# Patient Record
Sex: Female | Born: 1935 | Race: White | Hispanic: No | Smoking: Never smoker
Health system: Southern US, Community
[De-identification: ages and names within clinical notes are randomized; demographics above are authoritative.]

## PROBLEM LIST (undated history)

## (undated) DIAGNOSIS — M1990 Unspecified osteoarthritis, unspecified site: Secondary | ICD-10-CM

## (undated) DIAGNOSIS — C50919 Malignant neoplasm of unspecified site of unspecified female breast: Secondary | ICD-10-CM

## (undated) HISTORY — PX: MASTECTOMY: SHX3

## (undated) HISTORY — PX: JOINT REPLACEMENT: SHX530

## (undated) HISTORY — PX: ABDOMINAL HYSTERECTOMY: SHX81

## (undated) HISTORY — DX: Malignant neoplasm of unspecified site of unspecified female breast: C50.919

---

## 2013-03-16 ENCOUNTER — Other Ambulatory Visit (HOSPITAL_COMMUNITY): Payer: Self-pay | Admitting: "Endocrinology

## 2013-03-16 DIAGNOSIS — E041 Nontoxic single thyroid nodule: Secondary | ICD-10-CM

## 2013-03-22 ENCOUNTER — Ambulatory Visit (HOSPITAL_COMMUNITY)
Admission: RE | Admit: 2013-03-22 | Discharge: 2013-03-22 | Disposition: A | Discharge status: Home / Self Care | Payer: Medicare Other | Source: Ambulatory Visit | Attending: "Endocrinology | Admitting: "Endocrinology

## 2013-03-22 ENCOUNTER — Encounter (HOSPITAL_COMMUNITY): Payer: Self-pay

## 2013-03-22 DIAGNOSIS — E041 Nontoxic single thyroid nodule: Secondary | ICD-10-CM

## 2013-03-22 MED ORDER — LIDOCAINE HCL (PF) 2 % IJ SOLN
INTRAMUSCULAR | Status: AC
  Administered 2013-03-22: 10 mL
  Filled 2013-03-22: qty 10

## 2013-03-22 MED ORDER — LIDOCAINE HCL (PF) 2 % IJ SOLN
10.0000 mL | Freq: Once | INTRAMUSCULAR | Status: AC
  Administered 2013-03-22: 10 mL

## 2013-03-22 NOTE — Progress Notes (Signed)
Biopsy complete no signs of distress  

## 2015-12-03 ENCOUNTER — Ambulatory Visit: Payer: Medicare Other | Admitting: Neurology

## 2019-02-23 ENCOUNTER — Encounter (HOSPITAL_COMMUNITY): Payer: Self-pay | Admitting: Emergency Medicine

## 2019-02-23 ENCOUNTER — Other Ambulatory Visit: Payer: Self-pay

## 2019-02-23 ENCOUNTER — Emergency Department (HOSPITAL_COMMUNITY): Payer: Medicare Other

## 2019-02-23 ENCOUNTER — Emergency Department (HOSPITAL_COMMUNITY)
Admission: EM | Admit: 2019-02-23 | Discharge: 2019-02-23 | Disposition: A | Discharge status: Home / Self Care | Payer: Medicare Other | Attending: Emergency Medicine | Admitting: Emergency Medicine

## 2019-02-23 DIAGNOSIS — W010XXA Fall on same level from slipping, tripping and stumbling without subsequent striking against object, initial encounter: Secondary | ICD-10-CM | POA: Diagnosis not present

## 2019-02-23 DIAGNOSIS — S42002A Fracture of unspecified part of left clavicle, initial encounter for closed fracture: Secondary | ICD-10-CM | POA: Diagnosis not present

## 2019-02-23 DIAGNOSIS — S42125A Nondisplaced fracture of acromial process, left shoulder, initial encounter for closed fracture: Secondary | ICD-10-CM | POA: Diagnosis not present

## 2019-02-23 DIAGNOSIS — Y9389 Activity, other specified: Secondary | ICD-10-CM | POA: Diagnosis not present

## 2019-02-23 DIAGNOSIS — S4992XA Unspecified injury of left shoulder and upper arm, initial encounter: Secondary | ICD-10-CM | POA: Diagnosis present

## 2019-02-23 DIAGNOSIS — Y999 Unspecified external cause status: Secondary | ICD-10-CM | POA: Diagnosis not present

## 2019-02-23 DIAGNOSIS — Y929 Unspecified place or not applicable: Secondary | ICD-10-CM | POA: Diagnosis not present

## 2019-02-23 DIAGNOSIS — Z79899 Other long term (current) drug therapy: Secondary | ICD-10-CM | POA: Insufficient documentation

## 2019-02-23 HISTORY — DX: Unspecified osteoarthritis, unspecified site: M19.90

## 2019-02-23 MED ORDER — ONDANSETRON HCL 4 MG PO TABS
4.0000 mg | ORAL_TABLET | Freq: Once | ORAL | Status: AC
  Administered 2019-02-23: 4 mg via ORAL
  Filled 2019-02-23: qty 1

## 2019-02-23 MED ORDER — HYDROCODONE-ACETAMINOPHEN 5-325 MG PO TABS
1.0000 | ORAL_TABLET | Freq: Once | ORAL | Status: AC
  Administered 2019-02-23: 21:00:00 1 via ORAL
  Filled 2019-02-23: qty 1

## 2019-02-23 NOTE — ED Triage Notes (Signed)
Patient states she fell while chasing her dog today and is complaining of left shoulder pain.

## 2019-02-23 NOTE — Discharge Instructions (Signed)
Your clavicle or collarbone is broken on the left side, and 1 of the other bones of your upper shoulder called the acromium is also broken.  Please use an ice pack to your shoulder.  Please use the shoulder immobilizer until you are seen by the orthopedic specialist.  Please see Dr. Aline Brochure, or the orthopedic specialist of your choice concerning the fractures of your shoulder.  Please do not do any lifting or pushing or straining.  Use Tylenol for mild pain.  Use your hydrocodone for more severe pain.

## 2019-02-23 NOTE — ED Notes (Signed)
Patient transported to X-ray 

## 2019-02-23 NOTE — ED Provider Notes (Signed)
[facility] EMERGENCY DEPARTMENT Provider Note   CSN: [id number] Arrival date & time: 02/23/19  1709     History   Chief Complaint Chief Complaint  Patient presents with  . Shoulder Pain    HPI Bailey Anderson is a 85 y.o. female.     Patient is an 85 year old female who presents to the emergency department with injury to the left shoulder following a fall.  The patient states that she was going down an incline, stumbled, fell, and hit her shoulder on a city [place].  She has been unable to lift her arm since that time.  The pain is been getting progressively worse.  The patient denies any other injury.  She denies being on any anticoagulation medications.  No previous operations or procedures involving the left upper extremity.  The history is provided by the patient.  Shoulder Pain Associated symptoms: no back pain and no neck pain     Past Medical History:  Diagnosis Date  . Arthritis     There are no active problems to display for this patient.   Past Surgical History:  Procedure Laterality Date  . ABDOMINAL HYSTERECTOMY    . JOINT REPLACEMENT    . MASTECTOMY Right      OB History   No obstetric history on file.      Home Medications    Prior to Admission medications   Medication Sig Start Date End Date Taking? Authorizing Provider  celecoxib (CELEBREX) 50 MG capsule Take 50 mg by mouth daily.  01/19/19  Yes [provider]  Cholecalciferol (VITAMIN D-1000 MAX ST) 25 MCG (1000 UT) tablet Take 1,000 Units by mouth daily.    Yes [provider]  doxycycline (VIBRAMYCIN) 100 MG capsule Take 100 mg by mouth 2 (two) times daily. 10 day course starting on 02/09/2019 02/09/19  Yes [provider]  gabapentin (NEURONTIN) 300 MG capsule Take 300 mg by mouth 3 (three) times daily.  12/20/18  Yes [provider]  HYDROcodone-acetaminophen (NORCO) 10-325 MG tablet Take 1 tablet by mouth every 6 (six) hours as needed for moderate pain or  severe pain.  02/23/19  Yes [provider]  methotrexate (RHEUMATREX) 2.5 MG tablet Take by mouth.  02/10/19  Yes [provider]  Omega-3 Fatty Acids (OMEGA-3 2100) 1050 MG CAPS Take 1 capsule by mouth daily.    Yes [provider]  tiZANidine (ZANAFLEX) 4 MG tablet Take 4 mg by mouth every 8 (eight) hours as needed for muscle spasms.  01/19/19  Yes [provider]    Family History History reviewed. No pertinent family history.  Social History Social History   Tobacco Use  . Smoking status: Never Smoker  . Smokeless tobacco: Never Used  Substance Use Topics  . Alcohol use: No  . Drug use: No     Allergies   Red dye   Review of Systems Review of Systems  Constitutional: Negative for activity change and appetite change.  HENT: Negative for congestion, ear discharge, ear pain, facial swelling, nosebleeds, rhinorrhea, sneezing and tinnitus.   Eyes: Negative for photophobia, pain and discharge.  Respiratory: Negative for cough, choking, shortness of breath and wheezing.   Cardiovascular: Negative for chest pain, palpitations and leg swelling.  Gastrointestinal: Negative for abdominal pain, blood in stool, constipation, diarrhea, nausea and vomiting.  Genitourinary: Negative for difficulty urinating, dysuria, flank pain, frequency and hematuria.  Musculoskeletal: Positive for arthralgias. Negative for back pain, gait problem, myalgias and neck pain.  Skin:  Negative for color change, rash and wound.  Neurological: Negative for dizziness, seizures, syncope, facial asymmetry, speech difficulty, weakness and numbness.  Hematological: Negative for adenopathy. Does not bruise/bleed easily.  Psychiatric/Behavioral: Negative for agitation, confusion, hallucinations, self-injury and suicidal ideas. The patient is not nervous/anxious.      Physical Exam Updated Vital Signs BP (!) 171/82 (BP Location: Right Arm)   Pulse 85   Temp 98.1 F (36.7 C)  (Oral)   Resp 20   Ht 5\' 4"  (1.626 m)   Wt 59.9 kg   SpO2 95%   BMI 22.66 kg/m   Physical Exam Vitals signs and nursing note reviewed.  Constitutional:      Appearance: She is well-developed. She is not toxic-appearing.  HENT:     Head: Normocephalic and atraumatic.     Right Ear: Tympanic membrane and external ear normal.     Left Ear: Tympanic membrane and external ear normal.     Nose: Nose normal.     Mouth/Throat:     Mouth: Mucous membranes are moist.     Pharynx: Oropharynx is clear.  Eyes:     General: Lids are normal.     Extraocular Movements: Extraocular movements intact.     Pupils: Pupils are equal, round, and reactive to light.  Neck:     Musculoskeletal: Normal range of motion and neck supple. No muscular tenderness.     Vascular: No carotid bruit.  Cardiovascular:     Rate and Rhythm: Normal rate and regular rhythm.     Pulses: Normal pulses.     Heart sounds: Normal heart sounds.  Pulmonary:     Effort: Pulmonary effort is normal. No respiratory distress.     Breath sounds: Normal breath sounds. No wheezing.  Abdominal:     General: Abdomen is flat. Bowel sounds are normal.     Palpations: Abdomen is soft.     Tenderness: There is no abdominal tenderness. There is no guarding.     Comments: No rib area tenderness.  Musculoskeletal: Normal range of motion.     Comments: There is pain over the left clavicle, as well as pain to the humeral head area.  There is no displacement of the scapula.  There is good range of motion of the left elbow, wrist, and fingers.  There is degenerative joint disease changes present. There is full range of motion of the right shoulder, elbow, wrist, and fingers. Full range of motion of right and left lower extremities.  Lymphadenopathy:     Head:     Right side of head: No submandibular adenopathy.     Left side of head: No submandibular adenopathy.     Cervical: No cervical adenopathy.  Skin:    General: Skin is warm and  dry.  Neurological:     Mental Status: She is alert and oriented to person, place, and time.     Cranial Nerves: No cranial nerve deficit.     Sensory: No sensory deficit.  Psychiatric:        Speech: Speech normal.      ED Treatments / Results  Labs (all labs ordered are listed, but only abnormal results are displayed) Labs Reviewed - No data to display  EKG None  Radiology Dg Shoulder Left  Result Date: 02/23/2019 CLINICAL DATA:  Fall today with left shoulder pain EXAM: LEFT SHOULDER - 2+ VIEW COMPARISON:  None. FINDINGS: Comminuted fracture of the lateral left clavicle without significant displacement. Questionable nondisplaced acromion fracture on scapular  Y-view. No evidence of acromioclavicular separation. No glenohumeral dislocation. No suspicious focal osseous lesions. IMPRESSION: 1. Comminuted lateral left clavicle fracture without significant displacement. 2. Questionable nondisplaced left acromion fracture. Left shoulder CT may be considered for further characterization. 3. No glenohumeral dislocation. Electronically Signed   By: Ilona Sorrel M.D.   On: 02/23/2019 20:09    Procedures Procedures (including critical care time) FRACTURE CARE LEFT CLAVICLE/SHOULDER  Patient sustained a fall and injury to the left collarbone and clavicle.  X-ray shows a comminuted distal left clavicle fracture.  There is also a nondisplaced fracture of the acromion.  I discussed these fractures with the patient in terms which he understands.  I discussed with her the need for immobilization.  Patient gives permission for the procedures.  Patient identified by armband.  Procedure timeout taken.  The patient was fitted with a shoulder immobilizer.  Ice pack applied.  Patient given medication for assistance with pain.  Patient tolerated the procedure without problem.  After the procedure, the capillary refill is less than 2 seconds.  There are no temperature changes of the left upper extremity.   There is no complaint of the immobilizer being too tight or causing any other problem.  Patient tolerated procedure without problem.    Medications Ordered in ED Medications  HYDROcodone-acetaminophen (NORCO/VICODIN) 5-325 MG per tablet 1 tablet (1 tablet Oral Given 02/23/19 2052)  ondansetron (ZOFRAN) tablet 4 mg (4 mg Oral Given 02/23/19 2052)     Initial Impression / Assessment and Plan / ED Course  I have reviewed the triage vital signs and the nursing notes.  Pertinent labs & imaging results that were available during my care of the patient were reviewed by me and considered in my medical decision making (see chart for details).          Final Clinical Impressions(s) / ED Diagnoses MDM  Blood pressure is elevated at 171/82, otherwise vital signs within normal limits.  Pulse oximetry is 95% on room air.  Within normal limits by my interpretation.  The patient sustained a fall and injury to the left shoulder.  She is unable to raise her arm.  Palpation to the left shoulder is painful.  X-ray of the left shoulder shows a comminuted fracture of the lateral left clavicle without significant displacement.  There is also question of a nondisplaced acromion fracture.  A CT scan of the shoulder was suggested for more clear delineation of the fractures.  This is been ordered.  Patient treated in the emergency department with oral Norco to assist with her pain.  I have discussed with the patient the findings of the examination and the x-ray thus far.  CT scan confirms the comminuted distal left clavicle fracture.  This does not extend into the acromioclavicular joint space.  There is also a nondisplaced fracture of the acromion.  The patient has been fitted with a shoulder immobilizer and ice pack.  I have asked the patient to see Dr. Aline Brochure for reevaluation as soon as possible.  Patient is in agreement with this plan.   Final diagnoses:  Closed nondisplaced fracture of left  clavicle, unspecified part of clavicle, initial encounter  Closed nondisplaced fracture of acromial process of left scapula, initial encounter    ED Discharge Orders    None       Lily Kocher, PA-C 02/24/19 1617    Milton Ferguson, MD 02/26/19 1039

## 2020-10-18 IMAGING — CT CT SHOULDER*L* W/O CM
3 of 4 series · 14 of 33 positions shown, 16 images · non-contrast
Comparison: Left shoulder radiographs from the same day

CLINICAL DATA: Left clavicle fracture

EXAM:
CT OF THE UPPER LEFT EXTREMITY WITHOUT CONTRAST
TECHNIQUE: Multidetector CT imaging of the upper left extremity was performed
according to the standard protocol.

[Series 5: sag bone · sagittal · 0.26mm/px · 5 of 97 slices shown]
[im 17/97  bone]
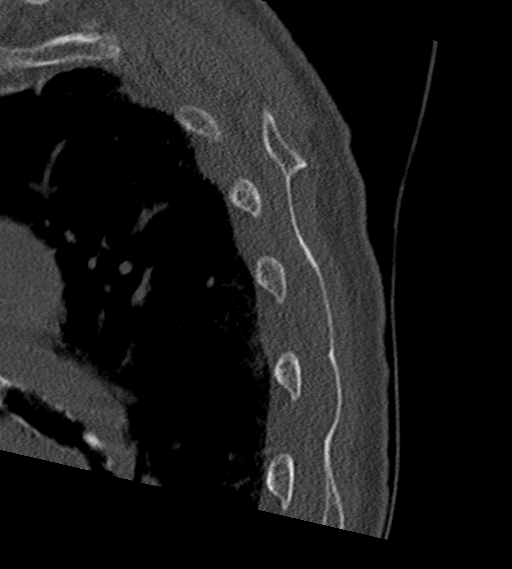
[im 33/97  bone]
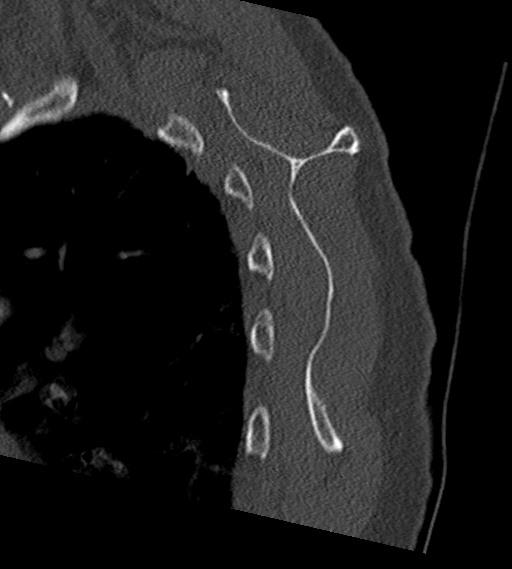
[im 49/97  bone]
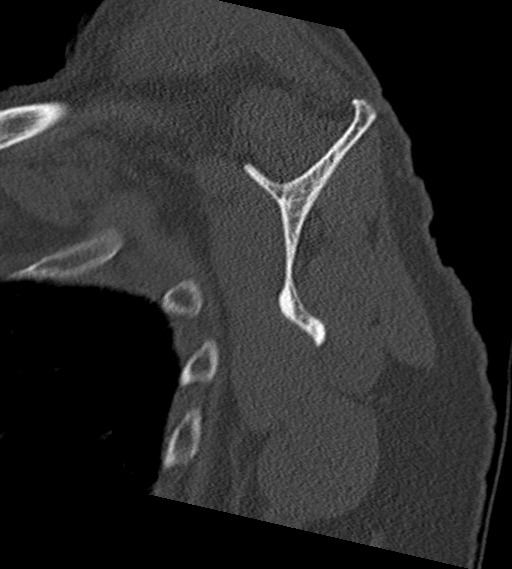
[im 65/97  bone]
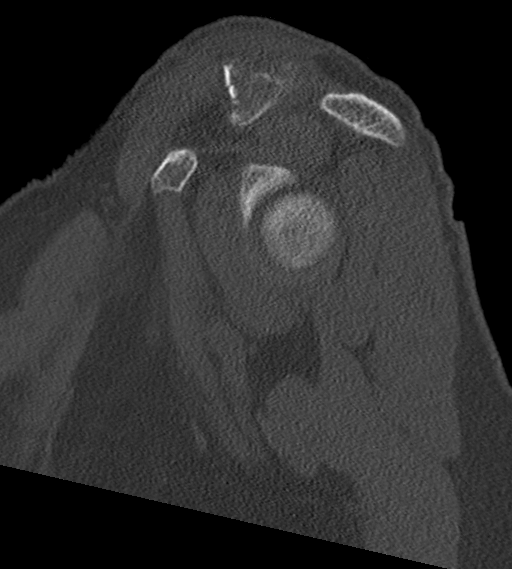
[im 81/97  bone]
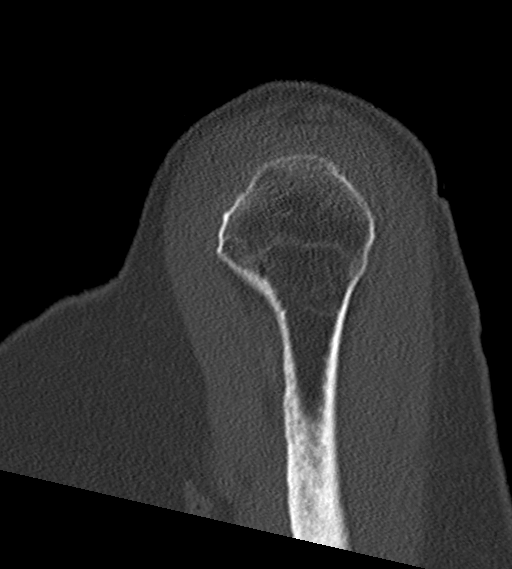

[Series 6: ax st · axial · 0.27mm/px · z∈[+1179,+1279]mm · 6 of 69 slices shown, 8 images]
[im 11/69  soft-tissue]
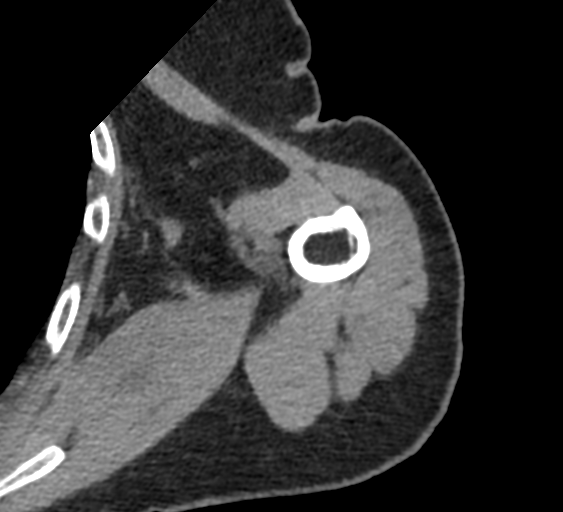
[im 11/69  bone]
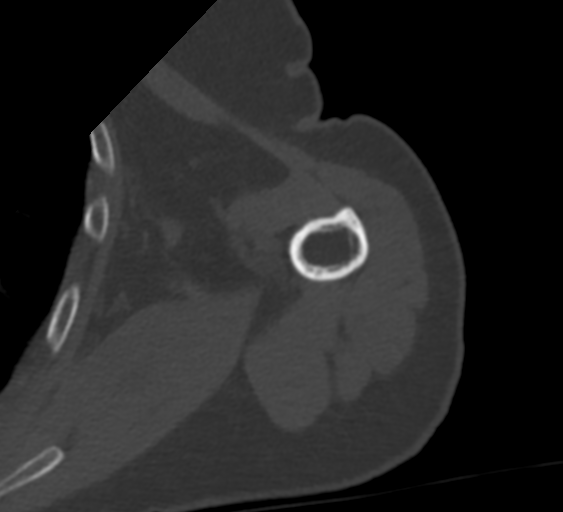
[im 21/69  bone]
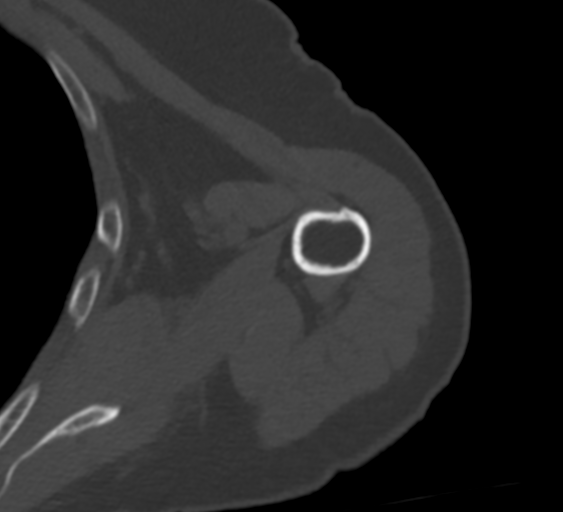
[im 32/69  bone]
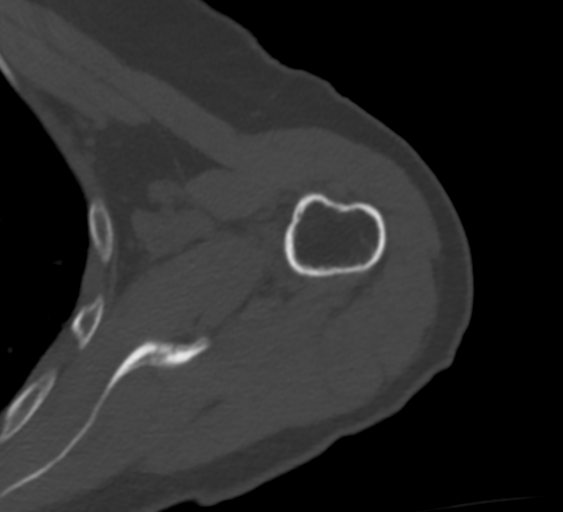
[im 42/69  bone]
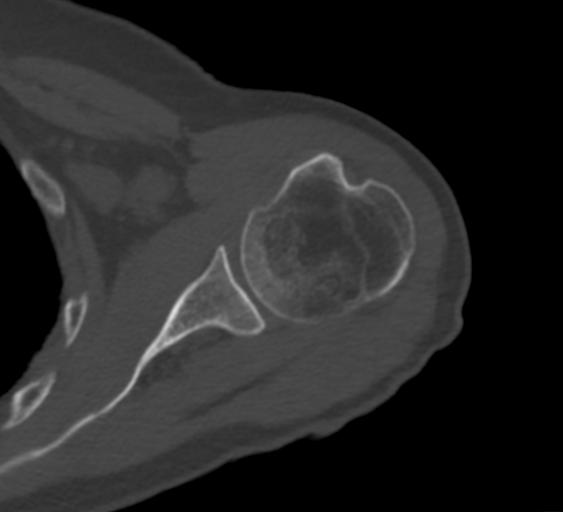
[im 53/69  soft-tissue]
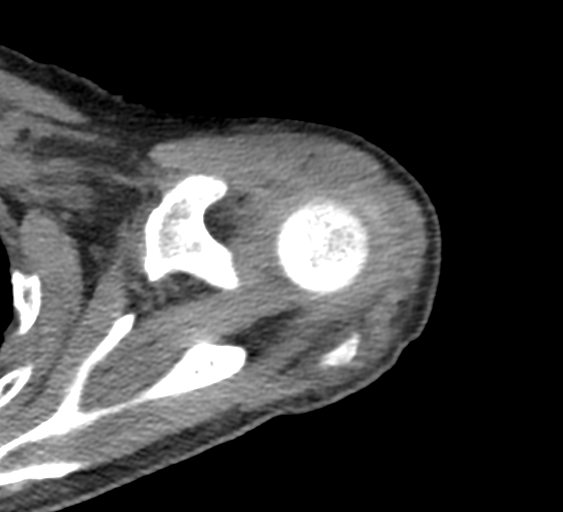
[im 53/69  bone]
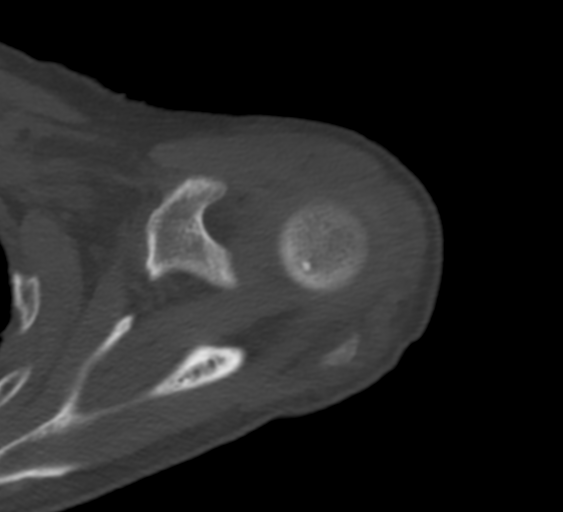
[im 63/69  bone]
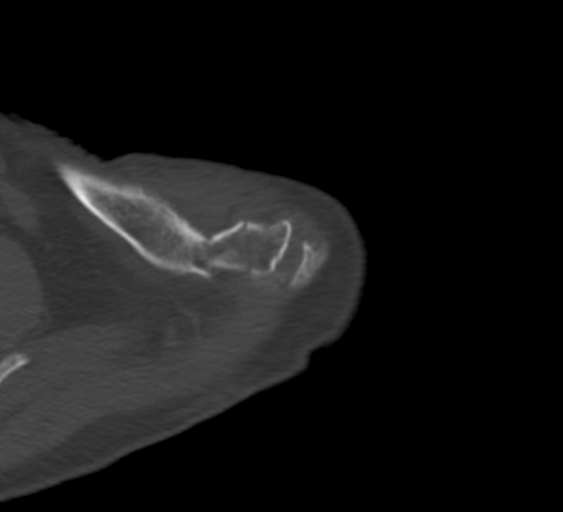

[Series 8: cor bone · coronal · 0.25mm/px · 3 of 81 slices shown]
[im 17/81  bone]
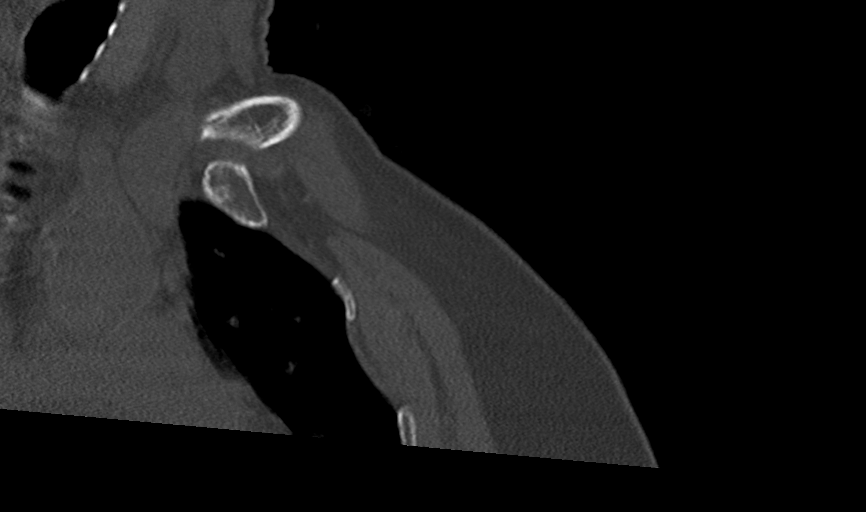
[im 33/81  bone]
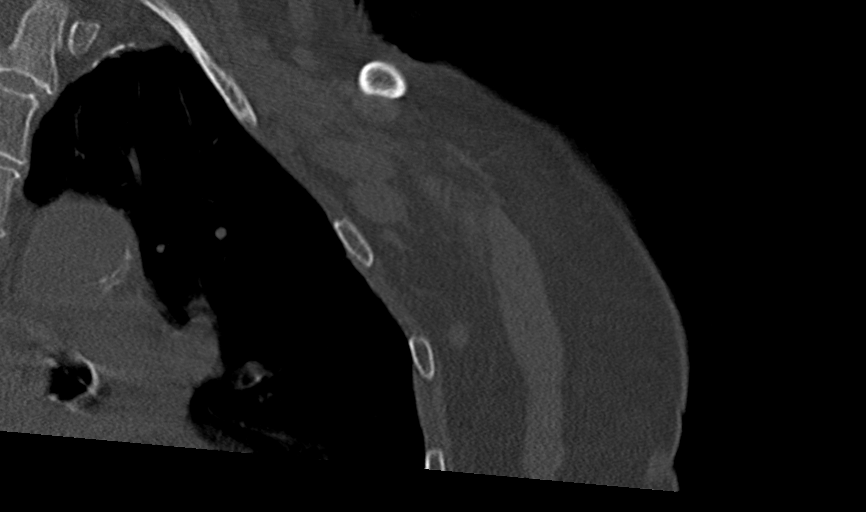
[im 49/81  bone]
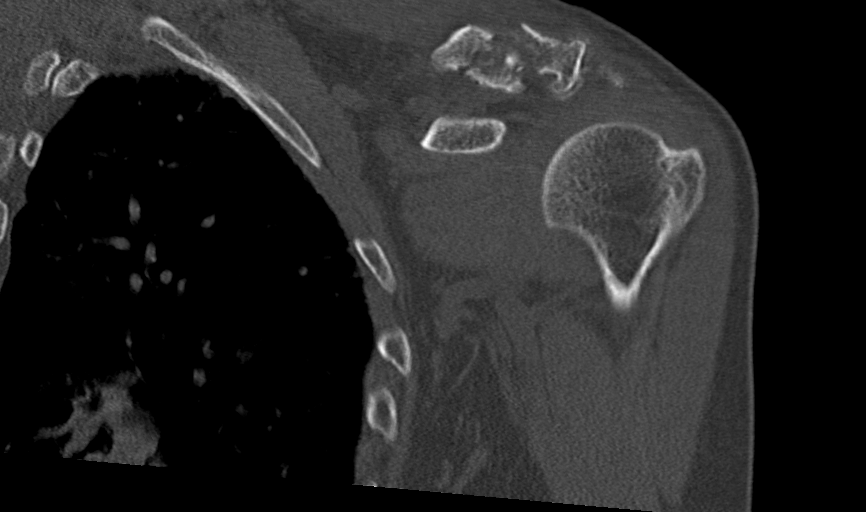

[14 of 33 positions shown; findings below may reference images not displayed]

FINDINGS: Bones/Joint/Cartilage

There is a comminuted distal left clavicle fracture. This does not
extend to the acromioclavicular joint space. There is no
acromioclavicular joint dislocation. There is a nondisplaced
fracture of the acromion. The glenohumeral joint space is preserved
and there is no humeral head dislocation.

Ligaments

Suboptimally assessed by CT.

Muscles and Tendons

No significant abnormality.

Soft tissues

There is soft tissue swelling overlying the clavicle and acromion
fractures.
IMPRESSION: Comminuted distal left clavicle fracture. Nondisplaced fracture of
the acromion.

## 2020-10-18 IMAGING — DX DG SHOULDER 2+V*L*
3 series · 3 of 3 positions shown · non-contrast
Comparison: None.

CLINICAL DATA: Fall today with left shoulder pain

EXAM:
LEFT SHOULDER - 2+ VIEW

[shoulder grashey]
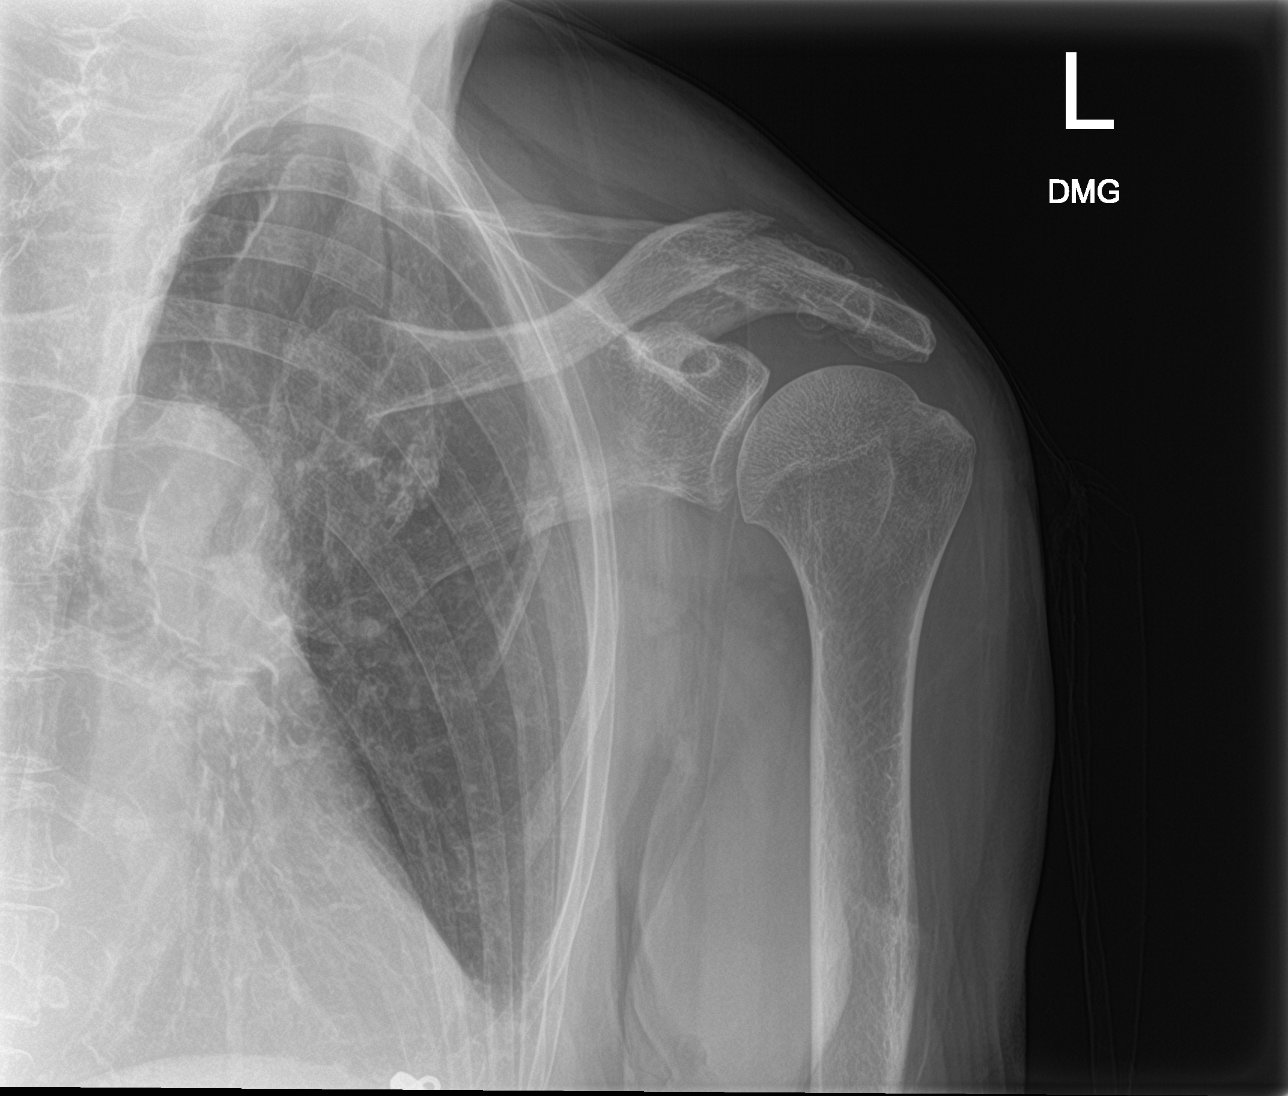

[shoulder y view]
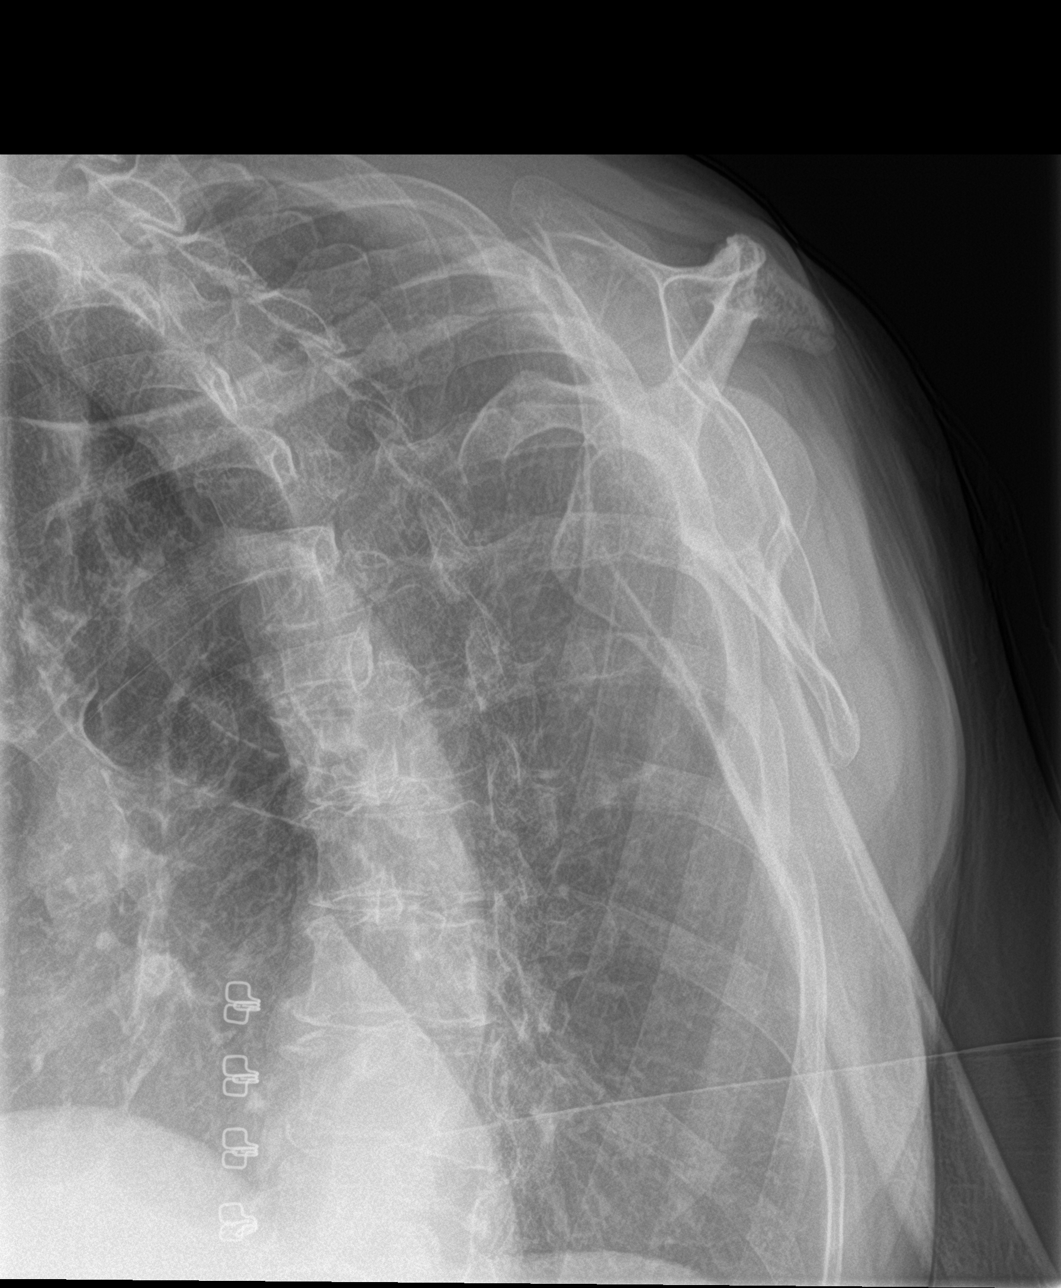

[shoulder axillary]
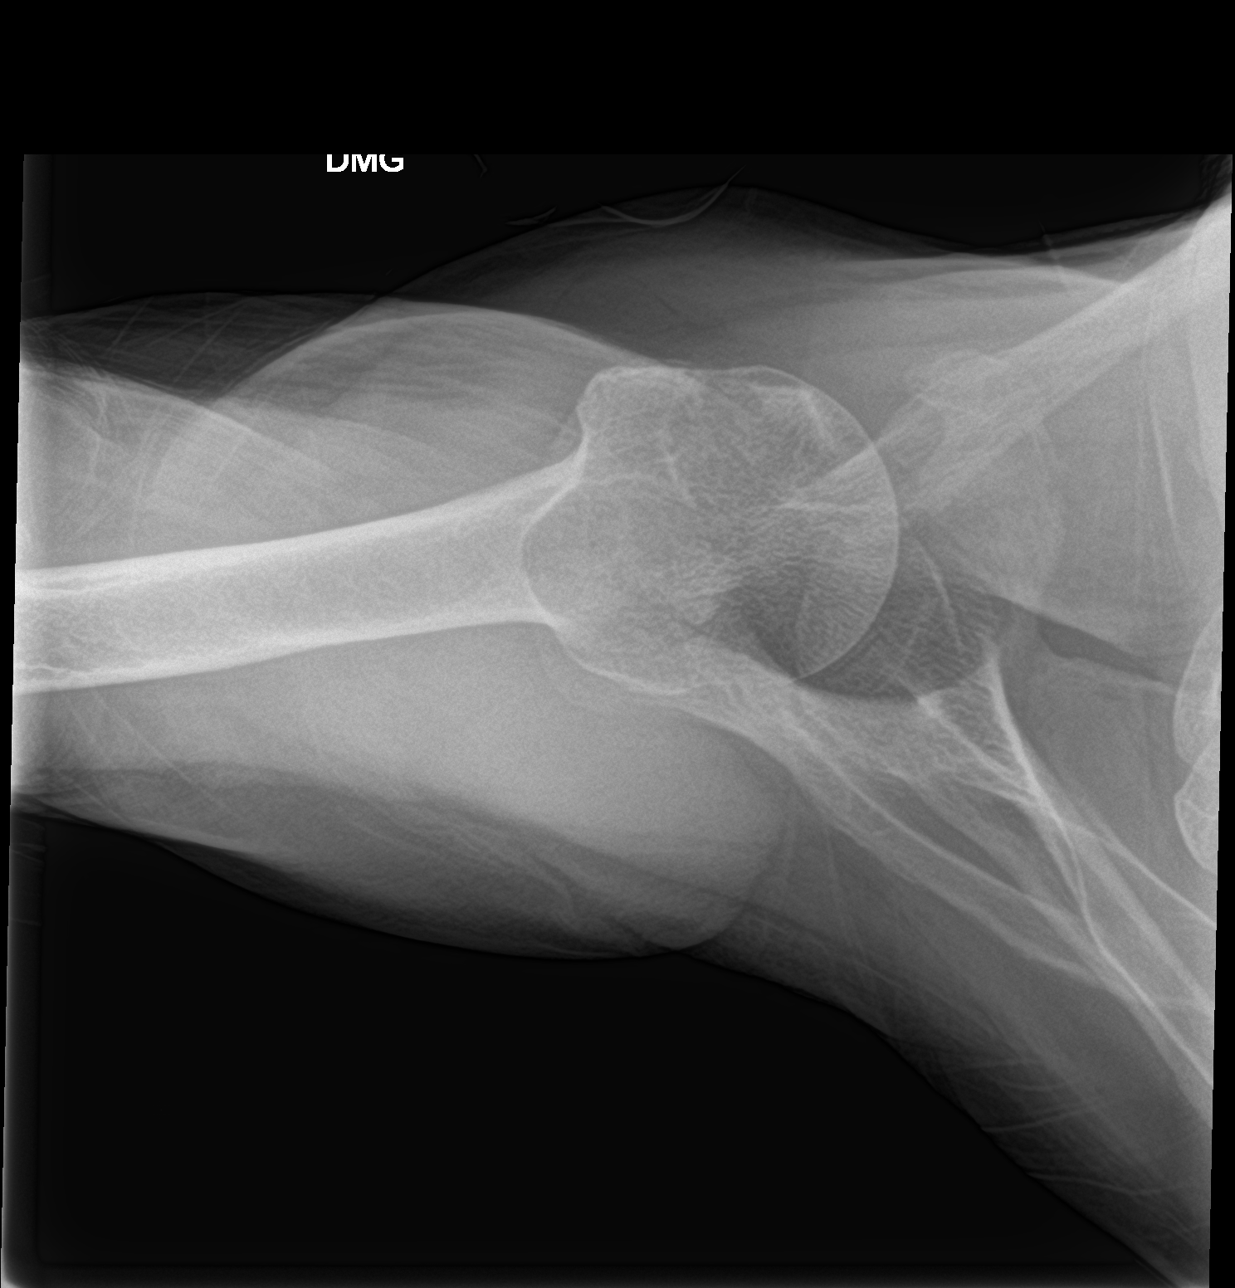

[3 of 3 positions shown; findings below may reference images not displayed]

FINDINGS: Comminuted fracture of the lateral left clavicle without significant
displacement. Questionable nondisplaced acromion fracture on
scapular Y-view. No evidence of acromioclavicular separation. No
glenohumeral dislocation. No suspicious focal osseous lesions.
IMPRESSION: 1. Comminuted lateral left clavicle fracture without significant
displacement.
2. Questionable nondisplaced left acromion fracture. Left shoulder
CT may be considered for further characterization.
3. No glenohumeral dislocation.

## 2021-12-11 LAB — BASIC METABOLIC PANEL
BUN: 16 (ref 4–21)
CO2: 33 — AB (ref 13–22)
Chloride: 91 — AB (ref 99–108)
Creatinine: 1 (ref 0.5–1.1)
Glucose: 100
Potassium: 3.6 mEq/L (ref 3.5–5.1)
Sodium: 131 — AB (ref 137–147)

## 2021-12-11 LAB — HEPATIC FUNCTION PANEL
ALT: 13 U/L (ref 7–35)
AST: 13 (ref 13–35)
Alkaline Phosphatase: 59 (ref 25–125)
Bilirubin, Total: 0.4

## 2021-12-11 LAB — COMPREHENSIVE METABOLIC PANEL
Albumin: 3.8 (ref 3.5–5.0)
Calcium: 9.5 (ref 8.7–10.7)

## 2022-02-04 ENCOUNTER — Encounter: Payer: Self-pay | Admitting: "Endocrinology

## 2022-02-04 ENCOUNTER — Ambulatory Visit (INDEPENDENT_AMBULATORY_CARE_PROVIDER_SITE_OTHER): Payer: Medicare Other | Admitting: "Endocrinology

## 2022-02-04 VITALS — BP 120/72 | HR 68 | Ht 64.0 in | Wt 114.2 lb

## 2022-02-04 DIAGNOSIS — E042 Nontoxic multinodular goiter: Secondary | ICD-10-CM

## 2022-02-04 NOTE — Progress Notes (Signed)
Endocrinology Consult Note                                            02/04/2022, 12:19 PM   Subjective:    Patient ID: Bailey Anderson, female    DOB: Jul 05, 1932, PCP Pcp, No   Past Medical History:  Diagnosis Date   Arthritis    Breast cancer [facility])    Past Surgical History:  Procedure Laterality Date   ABDOMINAL HYSTERECTOMY     JOINT REPLACEMENT     MASTECTOMY Right    Social History   Socioeconomic History   Marital status: Widowed    Spouse name: Not on file   Number of children: Not on file   Years of education: Not on file   Highest education level: Not on file  Occupational History   Not on file  Tobacco Use   Smoking status: Never   Smokeless tobacco: Never  Vaping Use   Vaping Use: Never used  Substance and Sexual Activity   Alcohol use: No   Drug use: No   Sexual activity: Not on file  Other Topics Concern   Not on file  Social History Narrative   Not on file   Social Determinants of Health   Financial Resource Strain: Not on file  Food Insecurity: Not on file  Transportation Needs: Not on file  Physical Activity: Not on file  Stress: Not on file  Social Connections: Not on file   Family History  Problem Relation Age of Onset   Heart attack Mother    Outpatient Encounter Medications as of 02/04/2022  Medication Sig   gabapentin (NEURONTIN) 100 MG capsule Take 1 capsule by mouth 3 (three) times daily.   hydrOXYzine (VISTARIL) 25 MG capsule Take 25 mg by mouth 3 (three) times daily as needed.   trimethoprim (TRIMPEX) 100 MG tablet Take 1 tablet by mouth daily.   celecoxib (CELEBREX) 50 MG capsule Take 50 mg by mouth daily.    Cholecalciferol (VITAMIN D-1000 MAX ST) 25 MCG (1000 UT) tablet Take 1,000 Units by mouth daily.  (Patient not taking: Reported on 07/01/2540)   folic acid (FOLVITE) 1 MG tablet Take 1 mg by mouth daily.   HYDROcodone-acetaminophen (NORCO) 10-325 MG tablet Take 1 tablet by mouth every 6 (six) hours as needed for  moderate pain or severe pain.    methotrexate (RHEUMATREX) 2.5 MG tablet Take by mouth.    nitrofurantoin (MACRODANTIN) 50 MG capsule    Omega-3 Fatty Acids (OMEGA-3 2100) 1050 MG CAPS Take 1 capsule by mouth daily.    oxybutynin (DITROPAN) 5 MG tablet Take 5 mg by mouth daily.   tiZANidine (ZANAFLEX) 4 MG tablet Take 4 mg by mouth every 8 (eight) hours as needed for muscle spasms.  (Patient not taking: Reported on 02/04/2022)   [DISCONTINUED] doxycycline (VIBRAMYCIN) 100 MG capsule Take 100 mg by mouth 2 (two) times daily. 10 day course starting on 02/09/2019   [DISCONTINUED] gabapentin (NEURONTIN) 300 MG capsule Take 300 mg by mouth 3 (three) times daily.    No facility-administered encounter medications on file as of 02/04/2022.   ALLERGIES: Allergies  Allergen Reactions   Red Dye     VACCINATION STATUS:  There is no immunization history on file for this patient.  HPI Bailey Anderson is 88 y.o. female who presents today with a  medical history as above. she is being seen in consultation for thyroid nodule requested by Bailey Anderson. History is obtained directly from the patient as well as chart review.  She denies any prior history of thyroid dysfunction.  she has been dealing with symptoms of unintended weight loss of 30 pounds over the last year, anxiety, tremors. She does not have recent thyroid function tests, however on January 27, 2022 she underwent thyroid ultrasound which showed multiple bilateral thyroid nodules, largest 1.1 cm on the right lobe which is not suspicious.  Overall the right lobe was 3.7 cm, left lobe 4.2 cm.  Patient denies dysphagia, shortness of breath, weight change. She denies family history of thyroid dysfunction or thyroid malignancy.   Review of Systems  Constitutional: + recent weight loss, +fatigue, +subjective hyperthermia,  Eyes: no blurry vision, no xerophthalmia ENT: no sore throat, no nodules palpated in throat, no dysphagia/odynophagia, no  hoarseness Cardiovascular: no Chest Pain, no Shortness of Breath, no palpitations, no leg swelling Respiratory: no cough, no shortness of breath Gastrointestinal: no Nausea/Vomiting/Diarhhea Musculoskeletal: no muscle/joint aches Skin: no rashes Neurological: no tremors, no numbness, no tingling, no dizziness Psychiatric: no depression, no anxiety  Objective:       02/04/2022    9:32 AM 02/23/2019   11:05 PM 02/23/2019    5:42 PM  Vitals with BMI  Height '5\' 4"'$   '5\' 4"'$   Weight 114 lbs 3 oz  132 lbs  BMI 44.81  85.63  Systolic 149 702 637  Diastolic 72 79 82  Pulse 68 89 85    BP 120/72   Pulse 68   Ht '5\' 4"'$  (1.626 m)   Wt 114 lb 3.2 oz (51.8 kg)   BMI 19.60 kg/m   Wt Readings from Last 3 Encounters:  02/04/22 114 lb 3.2 oz (51.8 kg)  02/23/19 132 lb (59.9 kg)    Physical Exam  Constitutional:  Body mass index is 19.6 kg/m.,  not in acute distress, normal state of mind, + gross hearing Eyes: PERRLA, EOMI, no exophthalmos ENT: moist mucous membranes, no gross thyromegaly, no gross cervical lymphadenopathy Cardiovascular: normal precordial activity, Regular Rate and Rhythm, no Murmur/Rubs/Gallops Respiratory:  adequate breathing efforts, no gross chest deformity, Clear to auscultation bilaterally Gastrointestinal: abdomen soft, Non -tender, No distension, Bowel Sounds present, no gross organomegaly Musculoskeletal: no gross deformities, strength intact in all four extremities Skin: moist, warm, no rashes Neurological: no tremor with outstretched hands, Deep tendon reflexes normal in bilateral lower extremities.  CMP ( most recent) CMP     Component Value Date/Time   NA 131 (A) 12/11/2021 0000   K 3.6 12/11/2021 0000   CL 91 (A) 12/11/2021 0000   CO2 33 (A) 12/11/2021 0000   BUN 16 12/11/2021 0000   CREATININE 1.0 12/11/2021 0000   CALCIUM 9.5 12/11/2021 0000   ALBUMIN 3.8 12/11/2021 0000   AST 13 12/11/2021 0000   ALT 13 12/11/2021 0000   ALKPHOS 59 12/11/2021  0000    January 27, 2022 thyroid ultrasound: Right lobe 3.7 cm, left lobe 4.3 bilateral small nodules, largest 1.1 cm nonsuspicious on the right lobe.    Assessment & Plan:   1. Multinodular goiter   - Bailey Anderson  is being seen at a kind request of Pcp, No. - I have reviewed her available thyroid records and clinically evaluated the patient. - Based on these reviews, she has multiple thyroid nodules,  however,  there is not sufficient information to proceed with definitive treatment  plan.  - she will need a   full set thyroid function test today and will return in 1 week for reevaluation.  A 1.1 cm nonsuspicious nodule in the right lobe of her thyroid does not need biopsy or intervention at this time.   If she is concerned for primary hyperthyroidism , she will be considered for repeat thyroid ultrasound in a year.   If her  labs are suggestive of hypothyroidism, she will be considered for thyroid uptake and scan to confirm the diagnosis.  - I did not initiate any new prescriptions today. - she is advised to maintain close follow up with  Dr. Shearon Stalls for her other care needs.    - Time spent with the patient: 50 minutes, of which >50% was spent in  counseling her about her multiple thyroid nodules and the rest in obtaining information about her symptoms, reviewing her previous labs/studies ( including abstractions from other facilities),  evaluations, and treatments,  and developing a plan to confirm diagnosis and long term treatment based on the latest standards of care/guidelines; and documenting her care.  Bailey Anderson participated in the discussions, expressed understanding, and voiced agreement with the above plans.  All questions were answered to her satisfaction. she is encouraged to contact clinic should she have any questions or concerns prior to her return visit.  Follow up plan: Return in about 1 week (around 02/11/2022) for Labs Today- Non-Fasting Ok.   Glade Lloyd,  MD [facility] [address] [place], [place] [address] Phone: [phone]  Fax: [phone]     02/04/2022, 12:19 PM  This note was partially dictated with voice recognition software. Similar sounding words can be transcribed inadequately or may not  be corrected upon review.

## 2022-02-05 LAB — TSH: TSH: 1.99 u[IU]/mL (ref 0.450–4.500)

## 2022-02-05 LAB — T3, FREE: T3, Free: 3 pg/mL (ref 2.0–4.4)

## 2022-02-05 LAB — T4, FREE: Free T4: 1.01 ng/dL (ref 0.82–1.77)

## 2022-02-05 LAB — THYROID PEROXIDASE ANTIBODY: Thyroperoxidase Ab SerPl-aCnc: 11 IU/mL (ref 0–34)

## 2022-02-05 LAB — THYROGLOBULIN ANTIBODY: Thyroglobulin Antibody: <1 IU/mL (ref 0.0–0.9)

## 2022-02-11 ENCOUNTER — Ambulatory Visit (INDEPENDENT_AMBULATORY_CARE_PROVIDER_SITE_OTHER): Payer: Medicare Other | Admitting: "Endocrinology

## 2022-02-11 ENCOUNTER — Encounter: Payer: Self-pay | Admitting: "Endocrinology

## 2022-02-11 VITALS — BP 140/68 | HR 72 | Ht 64.0 in | Wt 111.2 lb

## 2022-02-11 DIAGNOSIS — E042 Nontoxic multinodular goiter: Secondary | ICD-10-CM | POA: Diagnosis not present

## 2022-02-11 NOTE — Progress Notes (Signed)
02/11/2022, 6:58 PM  Endocrinology follow-up note   Subjective:    Patient ID: Bailey Anderson, female    DOB: 01/16/1933, PCP Bailey Rad, MD   Past Medical History:  Diagnosis Date   Arthritis    Breast cancer [facility])    Past Surgical History:  Procedure Laterality Date   ABDOMINAL HYSTERECTOMY     JOINT REPLACEMENT     MASTECTOMY Right    Social History   Socioeconomic History   Marital status: Widowed    Spouse name: Not on file   Number of children: Not on file   Years of education: Not on file   Highest education level: Not on file  Occupational History   Not on file  Tobacco Use   Smoking status: Never   Smokeless tobacco: Never  Vaping Use   Vaping Use: Never used  Substance and Sexual Activity   Alcohol use: No   Drug use: No   Sexual activity: Not on file  Other Topics Concern   Not on file  Social History Narrative   Not on file   Social Determinants of Health   Financial Resource Strain: Not on file  Food Insecurity: Not on file  Transportation Needs: Not on file  Physical Activity: Not on file  Stress: Not on file  Social Connections: Not on file   Family History  Problem Relation Age of Onset   Heart attack Mother    Outpatient Encounter Medications as of 02/11/2022  Medication Sig   celecoxib (CELEBREX) 50 MG capsule Take 50 mg by mouth daily.    Cholecalciferol (VITAMIN D-1000 MAX ST) 25 MCG (1000 UT) tablet Take 1,000 Units by mouth daily.  (Patient not taking: Reported on 4/33/2951)   folic acid (FOLVITE) 1 MG tablet Take 1 mg by mouth daily.   gabapentin (NEURONTIN) 100 MG capsule Take 1 capsule by mouth 3 (three) times daily.   HYDROcodone-acetaminophen (NORCO) 10-325 MG tablet Take 1 tablet by mouth every 6 (six) hours as needed for moderate pain or severe pain.    hydrOXYzine (VISTARIL) 25 MG capsule Take 25 mg by mouth 3 (three) times daily as needed.   methotrexate (RHEUMATREX) 2.5  MG tablet Take by mouth.    nitrofurantoin (MACRODANTIN) 50 MG capsule    Omega-3 Fatty Acids (OMEGA-3 2100) 1050 MG CAPS Take 1 capsule by mouth daily.    oxybutynin (DITROPAN) 5 MG tablet Take 5 mg by mouth daily.   tiZANidine (ZANAFLEX) 4 MG tablet Take 4 mg by mouth every 8 (eight) hours as needed for muscle spasms.  (Patient not taking: Reported on 02/04/2022)   trimethoprim (TRIMPEX) 100 MG tablet Take 1 tablet by mouth daily.   No facility-administered encounter medications on file as of 02/11/2022.   ALLERGIES: Allergies  Allergen Reactions   Red Dye     VACCINATION STATUS:  There is no immunization history on file for this patient.  HPI Bailey Anderson is 88 y.o. female who presents today with a medical history as above. she is being seen in follow-up after she was seen in consultation for thyroid nodule requested by Dr. Kennieth Anderson.  See notes from previous visit. History is obtained directly from the patient as well as chart review.  She denies any prior history of thyroid dysfunction. Admittedly, she is not eating well.  She has been losing weight progressively.  Her thyroid function tests are consistent with euthyroid state.   January 27, 2022 she underwent thyroid ultrasound which showed multiple bilateral thyroid nodules, largest 1.1 cm on the right lobe which is not suspicious.  Overall the right lobe was 3.7 cm, left lobe 4.2 cm.  Patient denies dysphagia, shortness of breath, weight change. She denies family history of thyroid dysfunction or thyroid malignancy.   Review of Systems  Constitutional: + recent weight loss, +fatigue, +subjective hyperthermia,  Eyes: no blurry vision, no xerophthalmia ENT: no sore throat, no nodules palpated in throat, no dysphagia/odynophagia, no hoarseness   Objective:       02/11/2022    2:51 PM 02/04/2022    9:32 AM 02/23/2019   11:05 PM  Vitals with BMI  Height '5\' 4"'$  '5\' 4"'$    Weight 111 lbs 3 oz 114 lbs 3 oz   BMI 60.63 01.60    Systolic 109 323 557  Diastolic 68 72 79  Pulse 72 68 89    BP (!) 140/68   Pulse 72   Ht '5\' 4"'$  (1.626 m)   Wt 111 lb 3.2 oz (50.4 kg)   BMI 19.09 kg/m   Wt Readings from Last 3 Encounters:  02/11/22 111 lb 3.2 oz (50.4 kg)  02/04/22 114 lb 3.2 oz (51.8 kg)  02/23/19 132 lb (59.9 kg)    Physical Exam  Constitutional:  Body mass index is 19.09 kg/m.,  not in acute distress, normal state of mind, + gross hearing deficit Eyes: PERRLA, EOMI, no exophthalmos ENT: moist mucous membranes, no gross thyromegaly, no gross cervical lymphadenopathy   CMP ( most recent) CMP     Component Value Date/Time   NA 131 (A) 12/11/2021 0000   K 3.6 12/11/2021 0000   CL 91 (A) 12/11/2021 0000   CO2 33 (A) 12/11/2021 0000   BUN 16 12/11/2021 0000   CREATININE 1.0 12/11/2021 0000   CALCIUM 9.5 12/11/2021 0000   ALBUMIN 3.8 12/11/2021 0000   AST 13 12/11/2021 0000   ALT 13 12/11/2021 0000   ALKPHOS 59 12/11/2021 0000    January 27, 2022 thyroid ultrasound: Right lobe 3.7 cm, left lobe 4.3 bilateral small nodules, largest 1.1 cm nonsuspicious on the right lobe.  Recent Results (from the past 2160 hour(s))  Basic metabolic panel     Status: Abnormal   Collection Time: 12/11/21 12:00 AM  Result Value Ref Range   Glucose 100    BUN 16 4 - 21   CO2 33 (A) 13 - 22   Creatinine 1.0 0.5 - 1.1   Potassium 3.6 3.5 - 5.1 mEq/L   Sodium 131 (A) 137 - 147   Chloride 91 (A) 99 - 108  Comprehensive metabolic panel     Status: None   Collection Time: 12/11/21 12:00 AM  Result Value Ref Range   Calcium 9.5 8.7 - 10.7   Albumin 3.8 3.5 - 5.0  Hepatic function panel     Status: None   Collection Time: 12/11/21 12:00 AM  Result Value Ref Range   Alkaline Phosphatase 59 25 - 125   ALT 13 7 - 35 U/L   AST 13 13 - 35   Bilirubin, Total 0.4   TSH     Status: None   Collection Time: 02/04/22 10:39 AM  Result Value Ref Range   TSH 1.990 0.450 - 4.500 uIU/mL  T4, free     Status: None    Collection Time: 02/04/22 10:39 AM  Result Value Ref Range   Free T4 1.01 0.82 - 1.77 ng/dL  T3, free     Status: None   Collection Time: 02/04/22 10:39 AM  Result Value Ref Range   T3, Free 3.0 2.0 - 4.4 pg/mL  Thyroid peroxidase antibody     Status: None   Collection Time: 02/04/22 10:39 AM  Result Value Ref Range   Thyroperoxidase Ab SerPl-aCnc 11 0 - 34 IU/mL  Thyroglobulin antibody     Status: None   Collection Time: 02/04/22 10:39 AM  Result Value Ref Range   Thyroglobulin Antibody <1.0 0.0 - 0.9 IU/mL    Comment: Thyroglobulin Antibody measured by [employer] Methodology     Assessment & Plan:   1. Multinodular goiter I reviewed her labs and previous thyroid ultrasound with her.  Her labs are consistent with euthyroid presentation, will not need intervention with thyroid hormone nor antithyroid interventions.   A 1.1 cm nonsuspicious nodule in the right lobe of her thyroid does not need biopsy or intervention at this time.   Her major concern of weight loss seems to be originating from inadequate oral intake. She needs social support to cook, and encourage eating. - she is advised to maintain close follow up with  Dr. Shearon Stalls for her other care needs.    I spent 21 minutes in the care of the patient today including review of labs from Thyroid Function, CMP, and other relevant labs ; imaging/biopsy records (current and previous including abstractions from other facilities); face-to-face time discussing  her lab results and symptoms, medications doses, her options of short and long term treatment based on the latest standards of care / guidelines;   and documenting the encounter.  Bailey Anderson  participated in the discussions, expressed understanding, and voiced agreement with the above plans.  All questions were answered to her satisfaction. she is encouraged to contact clinic should she have any questions or concerns prior to her return visit.   Follow up plan: Return in  about 1 year (around 02/12/2023) for F/U with Pre-visit Labs, Thyroid / Neck Ultrasound.   Glade Lloyd, MD [facility] [address] [place], [place] [address] Phone: [phone]  Fax: [phone]     02/11/2022, 6:58 PM  This note was partially dictated with voice recognition software. Similar sounding words can be transcribed inadequately or may not  be corrected upon review.

## 2023-02-10 ENCOUNTER — Telehealth: Payer: Self-pay | Admitting: "Endocrinology

## 2023-02-10 DIAGNOSIS — E042 Nontoxic multinodular goiter: Secondary | ICD-10-CM

## 2023-02-10 NOTE — Telephone Encounter (Signed)
Done

## 2023-02-10 NOTE — Telephone Encounter (Signed)
Please update labs

## 2023-02-17 ENCOUNTER — Ambulatory Visit: Payer: Medicare Other | Admitting: "Endocrinology
